# Patient Record
Sex: Male | Born: 1960 | Race: Black or African American | Hispanic: No | Marital: Single | Smoking: Current some day smoker
Health system: Southern US, Community
[De-identification: ages and names within clinical notes are randomized; demographics above are authoritative.]

---

## 2010-04-13 ENCOUNTER — Observation Stay (HOSPITAL_COMMUNITY): Admission: EM | Admit: 2010-04-13 | Discharge: 2010-04-14 | Payer: Self-pay | Admitting: Emergency Medicine

## 2010-04-13 ENCOUNTER — Ambulatory Visit: Payer: Self-pay | Admitting: Cardiology

## 2010-04-14 ENCOUNTER — Encounter (INDEPENDENT_AMBULATORY_CARE_PROVIDER_SITE_OTHER): Payer: Self-pay | Admitting: Emergency Medicine

## 2010-08-15 LAB — CBC
MCH: 29.8 pg (ref 26.0–34.0)
MCHC: 33.9 g/dL (ref 30.0–36.0)
MCV: 88.1 fL (ref 78.0–100.0)
Platelets: 228 10*3/uL (ref 150–400)
RDW: 13.7 % (ref 11.5–15.5)

## 2010-08-15 LAB — BASIC METABOLIC PANEL
CO2: 27 mEq/L (ref 19–32)
Calcium: 9.7 mg/dL (ref 8.4–10.5)
Chloride: 103 mEq/L (ref 96–112)
Creatinine, Ser: 1.03 mg/dL (ref 0.4–1.5)
GFR calc Af Amer: >60 mL/min (ref >60)
Glucose, Bld: 100 mg/dL — ABNORMAL HIGH (ref 70–99)
Sodium: 140 mEq/L (ref 135–145)

## 2010-08-15 LAB — URINALYSIS, ROUTINE W REFLEX MICROSCOPIC
Bilirubin Urine: NEGATIVE
Hgb urine dipstick: NEGATIVE
Ketones, ur: NEGATIVE mg/dL
Nitrite: NEGATIVE
Protein, ur: NEGATIVE mg/dL
Specific Gravity, Urine: 1.005 (ref 1.005–1.030)
Urobilinogen, UA: 0.2 mg/dL (ref 0.0–1.0)

## 2010-08-15 LAB — POCT CARDIAC MARKERS
CKMB, poc: 4 ng/mL (ref 1.0–8.0)
CKMB, poc: 4.4 ng/mL (ref 1.0–8.0)
Myoglobin, poc: 110 ng/mL (ref 12–200)
Myoglobin, poc: 124 ng/mL (ref 12–200)
Myoglobin, poc: 144 ng/mL (ref 12–200)

## 2010-08-15 LAB — DIFFERENTIAL
Basophils Relative: 0 % (ref 0–1)
Eosinophils Absolute: 0.1 10*3/uL (ref 0.0–0.7)
Eosinophils Relative: 1 % (ref 0–5)
Lymphs Abs: 1.6 10*3/uL (ref 0.7–4.0)
Neutrophils Relative %: 69 % (ref 43–77)

## 2011-10-19 ENCOUNTER — Encounter (HOSPITAL_BASED_OUTPATIENT_CLINIC_OR_DEPARTMENT_OTHER): Payer: Self-pay

## 2011-10-19 ENCOUNTER — Emergency Department (HOSPITAL_BASED_OUTPATIENT_CLINIC_OR_DEPARTMENT_OTHER)
Admission: EM | Admit: 2011-10-19 | Discharge: 2011-10-19 | Disposition: A | Discharge status: Home / Self Care | Payer: Self-pay | Attending: Emergency Medicine | Admitting: Emergency Medicine

## 2011-10-19 ENCOUNTER — Emergency Department (HOSPITAL_BASED_OUTPATIENT_CLINIC_OR_DEPARTMENT_OTHER): Payer: Self-pay

## 2011-10-19 DIAGNOSIS — S93499A Sprain of other ligament of unspecified ankle, initial encounter: Secondary | ICD-10-CM | POA: Insufficient documentation

## 2011-10-19 DIAGNOSIS — S86019A Strain of unspecified Achilles tendon, initial encounter: Secondary | ICD-10-CM

## 2011-10-19 DIAGNOSIS — X500XXA Overexertion from strenuous movement or load, initial encounter: Secondary | ICD-10-CM | POA: Insufficient documentation

## 2011-10-19 DIAGNOSIS — M25579 Pain in unspecified ankle and joints of unspecified foot: Secondary | ICD-10-CM | POA: Insufficient documentation

## 2011-10-19 DIAGNOSIS — Y990 Civilian activity done for income or pay: Secondary | ICD-10-CM | POA: Insufficient documentation

## 2011-10-19 MED ORDER — NAPROXEN 500 MG PO TABS: 500.0000 mg | ORAL_TABLET | Freq: Two times a day (BID) | ORAL | Status: AC

## 2011-10-19 MED ORDER — OXYCODONE-ACETAMINOPHEN 5-325 MG PO TABS: 1.0000 | ORAL_TABLET | Freq: Four times a day (QID) | ORAL | Status: AC | PRN

## 2011-10-19 MED ORDER — OXYCODONE-ACETAMINOPHEN 5-325 MG PO TABS
2.0000 | ORAL_TABLET | Freq: Once | ORAL | Status: AC
  Administered 2011-10-19: 2 via ORAL
  Filled 2011-10-19: qty 2

## 2011-10-19 MED ORDER — IBUPROFEN 800 MG PO TABS
800.0000 mg | ORAL_TABLET | Freq: Once | ORAL | Status: AC
  Administered 2011-10-19: 800 mg via ORAL
  Filled 2011-10-19: qty 1

## 2011-10-19 NOTE — ED Notes (Signed)
Was pushing car at work -felt a pop to right posterior LE

## 2011-10-19 NOTE — ED Provider Notes (Signed)
History     CSN: [id number]  Arrival date & time 10/19/11  1140   First MD Initiated Contact with Patient 10/19/11 1202      Chief Complaint  Patient presents with  . Leg Injury    (Consider location/radiation/quality/duration/timing/severity/associated sxs/prior treatment) Patient is a 51 y.o. male presenting with ankle pain. The history is provided by the patient. No language interpreter was used.  Ankle Pain  The incident occurred less than 1 hour ago. The incident occurred at work. Injury mechanism: while pushing a car. The pain is present in the right ankle. The quality of the pain is described as sharp. The pain is severe. The pain has been constant since onset. Associated symptoms include loss of motion. Pertinent negatives include no numbness, no inability to bear weight, no muscle weakness, no loss of sensation and no tingling. He reports no foreign bodies present. The symptoms are aggravated by activity and bearing weight. He has tried nothing for the symptoms.    History reviewed. No pertinent past medical history.  History reviewed. No pertinent past surgical history.  No family history on file.  History  Substance Use Topics  . Smoking status: Never Smoker   . Smokeless tobacco: Not on file  . Alcohol Use: No      Review of Systems  Constitutional: Negative for fever, chills, activity change and fatigue.  HENT: Negative for congestion, sore throat, rhinorrhea and neck stiffness.   Respiratory: Negative for cough and shortness of breath.   Cardiovascular: Negative for chest pain and palpitations.  Gastrointestinal: Negative for nausea, vomiting and abdominal pain.  Genitourinary: Negative for dysuria, urgency, frequency and flank pain.  Musculoskeletal: Positive for arthralgias. Negative for myalgias, joint swelling and gait problem.  Neurological: Negative for dizziness, tingling, weakness, light-headedness, numbness and headaches.  All other systems reviewed  and are negative.    Allergies  Review of patient's allergies indicates no known allergies.  Home Medications   Current Outpatient Rx  Name Route Sig Dispense Refill  . HYDROCODONE-ACETAMINOPHEN PO Oral Take by mouth.    Marland Kitchen NAPROXEN 500 MG PO TABS Oral Take 1 tablet (500 mg total) by mouth 2 (two) times daily. 30 tablet 0  . OXYCODONE-ACETAMINOPHEN 5-325 MG PO TABS Oral Take 1-2 tablets by mouth every 6 (six) hours as needed for pain. 20 tablet 0    BP 125/75  Pulse 90  Temp(Src) 98.8 F (37.1 C) (Oral)  Resp 20  SpO2 98%  Physical Exam  Nursing note and vitals reviewed. Constitutional: He is oriented to person, place, and time. He appears well-developed and well-nourished. No distress.  HENT:  Head: Normocephalic and atraumatic.  Mouth/Throat: Oropharynx is clear and moist.  Eyes: Conjunctivae and EOM are normal. Pupils are equal, round, and reactive to light.  Neck: Normal range of motion. Neck supple.  Cardiovascular: Normal rate, regular rhythm, normal heart sounds and intact distal pulses.  Exam reveals no gallop and no friction rub.   No murmur heard. Pulmonary/Chest: Effort normal and breath sounds normal. No respiratory distress. He exhibits no tenderness.  Abdominal: Soft. Bowel sounds are normal. There is no tenderness. There is no rebound and no guarding.  Musculoskeletal:       Right ankle: Achilles tendon exhibits pain and defect.       On reviewing the right Achilles tendon under ultrasound it appears there is a slight defect consistent with at least a partial tear. He was able to plantar flex his right foot only slightly.  Neurological:  He is alert and oriented to person, place, and time. He has normal strength. No cranial nerve deficit or sensory deficit.  Skin: Skin is warm and dry. No rash noted.    ED Course  Procedures (including critical care time)  Labs Reviewed - No data to display Dg Ankle Complete Right  10/19/2011  *RADIOLOGY REPORT*  Clinical  Data: Ankle pain, injury.  RIGHT ANKLE - COMPLETE 3+ VIEW  Comparison: None  Findings: No acute bony abnormality.  Specifically, no fracture, subluxation, or dislocation.  Soft tissues are intact.  Joint spaces are maintained.  IMPRESSION: No acute bony abnormality.  Original Report Authenticated By: Cyndie Chime, M.D.     1. Achilles rupture       MDM  Ultrasound shows partial Achilles tendon rupture. X-rays negative. Was placed in a plantar flexed splint. Be placed on crutches. Encouraged to elevate apply ice on not ambulatory. Instructed to followup with orthopedics on Tuesday after talking with Dr. Ranell Patrick.        Dayton Bailiff, MD 10/19/11 1304

## 2011-10-19 NOTE — Discharge Instructions (Signed)
Partial (Incomplete) Achilles Tendon Rupture  Tendons are the tough fibrous and stretchy (elastic) tissues that connect muscle to bone. The Achilles tendon is the large cord-like structure (tendon) in the back of the leg, just above the foot. It attaches the large muscles of the lower leg to the heel bone. You can feel this as the large cord just above the heel. The diagnosis of incomplete Achilles tendon tear (rupture) is made by examination. X-rays will determine the extent of the damage (injury). The injury may be casted or immobilized for 6 to 10 weeks. An incomplete tear of the tendon may repair itself with rest and immobilization. Immobilization means that the injured tendon is kept in position with a cast or splint. It is not allowed to move. Once your caregiver feels you have healed well enough, he or she will provide exercises you can do to make the injured tendon feel better (rehabilitate). HOME CARE INSTRUCTIONS   Keep the leg elevated above the level of the heart (the center of the chest) at all times when not using the bathroom. Do not dangle the leg over a chair, couch, or bed. When lying down, elevate your leg on a couple pillows. Elevation prevents swelling and reduces pain.   Apply ice to the injury for 15 to 20 minutes, 3 to 4 times per day. Put the ice in a plastic bag and place a towel between the bag of ice and your skin, splint, or immobilization device.   Use crutches and move about only as instructed.   Avoid use other than gentle range of motion of the toes while the tendon is painful. Do not resume use until instructed by your caregiver. Usually, full rehabilitation will begin sometime after the casts or splints are removed. Then begin use gradually, as directed. Do not increase use to the point of pain. If pain does develop, decrease use and continue the above measures. Gradually increase activities that do not cause discomfort until you achieve normal use without pain.   Do  not drive a car until your caregiver specifically tells you it is safe to do so.   Only take over-the-counter or prescription medicines for pain, discomfort, or fever as directed by your caregiver.   If your caregiver has given you a follow-up appointment, it is very important to keep that appointment. Not keeping the appointment could result in a chronic or permanent injury, pain, and disability. If there is any problem keeping the appointment, you must call back to this facility for assistance.  SEEK MEDICAL CARE IF:   Your pain and swelling increase or pain is uncontrolled with medications.   You develop new unexplained problems (symptoms) or an increase of the symptoms which brought you to your caregiver.   You develop an inability to move your toes or foot, develop warmth and swelling in your foot, or begin running an unexplained temperature.  MAKE SURE YOU:   Understand these instructions.   Will watch your condition.   Will get help right away if you are not doing well or get worse.  Document Released: 02/28/2005 Document Revised: 05/10/2011 Document Reviewed: 12/23/2007 [employer] Patient Information 2012 [employer]. 

## 2011-12-04 ENCOUNTER — Ambulatory Visit: Payer: Self-pay | Attending: Orthopedic Surgery

## 2011-12-04 DIAGNOSIS — M6281 Muscle weakness (generalized): Secondary | ICD-10-CM | POA: Insufficient documentation

## 2011-12-04 DIAGNOSIS — M25579 Pain in unspecified ankle and joints of unspecified foot: Secondary | ICD-10-CM | POA: Insufficient documentation

## 2011-12-04 DIAGNOSIS — R269 Unspecified abnormalities of gait and mobility: Secondary | ICD-10-CM | POA: Insufficient documentation

## 2011-12-04 DIAGNOSIS — M25673 Stiffness of unspecified ankle, not elsewhere classified: Secondary | ICD-10-CM | POA: Insufficient documentation

## 2011-12-04 DIAGNOSIS — IMO0001 Reserved for inherently not codable concepts without codable children: Secondary | ICD-10-CM | POA: Insufficient documentation

## 2011-12-04 DIAGNOSIS — M25676 Stiffness of unspecified foot, not elsewhere classified: Secondary | ICD-10-CM | POA: Insufficient documentation

## 2011-12-07 ENCOUNTER — Ambulatory Visit: Payer: Self-pay | Admitting: Physical Therapy

## 2011-12-10 ENCOUNTER — Ambulatory Visit: Payer: Self-pay | Admitting: Physical Therapy

## 2011-12-13 ENCOUNTER — Ambulatory Visit: Payer: Self-pay

## 2011-12-17 ENCOUNTER — Ambulatory Visit: Payer: Self-pay

## 2011-12-20 ENCOUNTER — Ambulatory Visit: Payer: Self-pay

## 2011-12-24 ENCOUNTER — Ambulatory Visit: Payer: Self-pay

## 2011-12-27 ENCOUNTER — Ambulatory Visit: Payer: Self-pay | Admitting: Physical Therapy

## 2012-01-02 ENCOUNTER — Ambulatory Visit: Payer: Self-pay

## 2012-01-04 ENCOUNTER — Encounter: Payer: Self-pay | Admitting: Physical Therapy

## 2018-12-05 ENCOUNTER — Emergency Department (HOSPITAL_COMMUNITY)
Admission: EM | Admit: 2018-12-05 | Discharge: 2018-12-05 | Disposition: A | Discharge status: Home / Self Care | Payer: Self-pay | Attending: Emergency Medicine | Admitting: Emergency Medicine

## 2018-12-05 ENCOUNTER — Encounter (HOSPITAL_COMMUNITY): Payer: Self-pay | Admitting: Emergency Medicine

## 2018-12-05 ENCOUNTER — Emergency Department (HOSPITAL_COMMUNITY): Payer: Self-pay

## 2018-12-05 ENCOUNTER — Other Ambulatory Visit: Payer: Self-pay

## 2018-12-05 DIAGNOSIS — N200 Calculus of kidney: Secondary | ICD-10-CM | POA: Insufficient documentation

## 2018-12-05 DIAGNOSIS — F17200 Nicotine dependence, unspecified, uncomplicated: Secondary | ICD-10-CM | POA: Insufficient documentation

## 2018-12-05 LAB — CBC
HCT: 38.3 % — ABNORMAL LOW (ref 39.0–52.0)
Hemoglobin: 12.5 g/dL — ABNORMAL LOW (ref 13.0–17.0)
MCH: 30.7 pg (ref 26.0–34.0)
MCHC: 32.6 g/dL (ref 30.0–36.0)
MCV: 94.1 fL (ref 80.0–100.0)
Platelets: 263 10*3/uL (ref 150–400)
RBC: 4.07 MIL/uL — ABNORMAL LOW (ref 4.22–5.81)
RDW: 15.5 % (ref 11.5–15.5)
WBC: 13.6 10*3/uL — ABNORMAL HIGH (ref 4.0–10.5)
nRBC: 0 % (ref 0.0–0.2)

## 2018-12-05 LAB — COMPREHENSIVE METABOLIC PANEL
ALT: 25 U/L (ref 0–44)
AST: 24 U/L (ref 15–41)
Albumin: 4.2 g/dL (ref 3.5–5.0)
Alkaline Phosphatase: 54 U/L (ref 38–126)
Anion gap: 11 (ref 5–15)
BUN: 14 mg/dL (ref 6–20)
CO2: 25 mmol/L (ref 22–32)
Calcium: 9.4 mg/dL (ref 8.9–10.3)
Chloride: 105 mmol/L (ref 98–111)
Creatinine, Ser: 1.39 mg/dL — ABNORMAL HIGH (ref 0.61–1.24)
GFR calc Af Amer: >60 mL/min (ref >60)
GFR calc non Af Amer: 57 mL/min — ABNORMAL LOW (ref >60)
Glucose, Bld: 135 mg/dL — ABNORMAL HIGH (ref 70–99)
Potassium: 3.6 mmol/L (ref 3.5–5.1)
Sodium: 141 mmol/L (ref 135–145)
Total Bilirubin: 0.5 mg/dL (ref 0.3–1.2)
Total Protein: 7.6 g/dL (ref 6.5–8.1)

## 2018-12-05 LAB — URINALYSIS, ROUTINE W REFLEX MICROSCOPIC
Bilirubin Urine: NEGATIVE
Glucose, UA: NEGATIVE mg/dL
Hgb urine dipstick: NEGATIVE
Ketones, ur: NEGATIVE mg/dL
Leukocytes,Ua: NEGATIVE
Nitrite: NEGATIVE
Protein, ur: NEGATIVE mg/dL
Specific Gravity, Urine: 1.034 — ABNORMAL HIGH (ref 1.005–1.030)
pH: 6 (ref 5.0–8.0)

## 2018-12-05 LAB — LIPASE, BLOOD: Lipase: 45 U/L (ref 11–51)

## 2018-12-05 MED ORDER — MORPHINE SULFATE 15 MG PO TABS: 15.0000 mg | ORAL_TABLET | ORAL | 0 refills | Status: AC | PRN

## 2018-12-05 MED ORDER — ONDANSETRON HCL 4 MG/2ML IJ SOLN
4.0000 mg | Freq: Once | INTRAMUSCULAR | Status: AC
  Administered 2018-12-05: 4 mg via INTRAVENOUS
  Filled 2018-12-05: qty 2

## 2018-12-05 MED ORDER — LACTATED RINGERS IV BOLUS
1000.0000 mL | Freq: Once | INTRAVENOUS | Status: AC
  Administered 2018-12-05: 1000 mL via INTRAVENOUS

## 2018-12-05 MED ORDER — MORPHINE SULFATE (PF) 4 MG/ML IV SOLN
4.0000 mg | Freq: Once | INTRAVENOUS | Status: AC
  Administered 2018-12-05: 4 mg via INTRAVENOUS
  Filled 2018-12-05: qty 1

## 2018-12-05 MED ORDER — IOPAMIDOL (ISOVUE-300) INJECTION 61%
100.0000 mL | Freq: Once | INTRAVENOUS | Status: AC | PRN
  Administered 2018-12-05: 100 mL via INTRAVENOUS

## 2018-12-05 MED ORDER — TAMSULOSIN HCL 0.4 MG PO CAPS: 0.4000 mg | ORAL_CAPSULE | Freq: Every day | ORAL | 0 refills | Status: AC

## 2018-12-05 MED ORDER — ONDANSETRON 4 MG PO TBDP: 4.0000 mg | ORAL_TABLET | Freq: Three times a day (TID) | ORAL | 0 refills | Status: AC | PRN

## 2018-12-05 MED ORDER — KETOROLAC TROMETHAMINE 15 MG/ML IJ SOLN
15.0000 mg | Freq: Once | INTRAMUSCULAR | Status: AC
  Administered 2018-12-05: 15 mg via INTRAVENOUS
  Filled 2018-12-05: qty 1

## 2018-12-05 MED ORDER — FENTANYL CITRATE (PF) 100 MCG/2ML IJ SOLN
50.0000 ug | Freq: Once | INTRAMUSCULAR | Status: AC
Start: 2018-12-05 — End: 2018-12-05
  Administered 2018-12-05: 50 ug via INTRAVENOUS
  Filled 2018-12-05: qty 2

## 2018-12-05 NOTE — ED Notes (Signed)
Patient made aware he needs to provide urine.

## 2018-12-05 NOTE — ED Triage Notes (Signed)
Patient woke up this morning with left abdominal pain , denies emesis or diarrhea , no fever or chills .

## 2018-12-05 NOTE — Discharge Instructions (Signed)
Return for fever or uncontrolled pain.  Follow up with the urologist.  Follow up with your PCP.   Take 4 over the counter ibuprofen tablets 3 times a day or 2 over-the-counter naproxen tablets twice a day for pain. Also take tylenol 1000mg (2 extra strength) four times a day.   Then take the pain medicine if you feel like you need it. Narcotics do not help with the pain, they only make you care about it less.  You can become addicted to this, people may break into your house to steal it.  It will constipate you.  If you drive under the influence of this medicine you can get a DUI.

## 2018-12-05 NOTE — ED Provider Notes (Signed)
I received the patient in signout from Dr. Dayna Barker.  Briefly the patient is a 58 year old male with a chief complaint of left lower quadrant abdominal pain.  Occurred after sexual intercourse.  CT scan pending to evaluate for diverticulitis.  Patient was found to have a 6 mm UVJ stone.  Patient feeling mildly better on my reassessment.  UA is negative for infection.  We will have the patient follow-up with urology.  Short course of narcotics and Flomax.  Will return for fever her uncontrolled pain.   Deno Etienne, DO 12/05/18 [phone]

## 2018-12-05 NOTE — ED Notes (Signed)
[place] to pt [phone]

## 2018-12-05 NOTE — ED Notes (Signed)
Patient transported to CT scan . 

## 2018-12-05 NOTE — ED Provider Notes (Signed)
Emergency Department Provider Note   I have reviewed the triage vital signs and the nursing notes.   HISTORY  Chief Complaint Abdominal Pain   HPI Jason Torres is a 58 y.o. male this morning with a severe sharp pain to his left lower quadrant and nausea.  No diarrhea or constipation.  States when he went to the bathroom to see did have a bowel movement to see if that would help he had little urination which seemed to make the pain worse.   Never any that is before.  No testicular pain.  Does not radiate to his back or anywhere else in his abdomen.  No rashes.  No recent trauma.  No alcohol or tobacco.  Patient states he did have intercourse last night but the pain did not start till this morning.  No other associated or modifying symptoms.    History reviewed. No pertinent past medical history.  There are no active problems to display for this patient.   History reviewed. No pertinent surgical history.  Current Outpatient Rx  . Order #: [id number] Class: Normal  . Order #: [id number] Class: Normal  . Order #: [id number] Class: Normal    Allergies Patient has no known allergies.  No family history on file.  Social History Social History   Tobacco Use  . Smoking status: Current Some Day Smoker  . Smokeless tobacco: Never Used  Substance Use Topics  . Alcohol use: No  . Drug use: Yes    Types: Marijuana    Review of Systems  All other systems negative except as documented in the HPI. All pertinent positives and negatives as reviewed in the HPI. ____________________________________________   PHYSICAL EXAM:  VITAL SIGNS: ED Triage Vitals  Enc Vitals Group     BP 12/05/18 0548 (!) 150/94     Pulse Rate 12/05/18 0548 62     Resp 12/05/18 0548 16     Temp 12/05/18 0548 98.4 F (36.9 C)     Temp Source 12/05/18 0548 Oral     SpO2 12/05/18 0548 99 %    Constitutional: Alert and oriented. Well appearing and in no acute distress. Eyes: Conjunctivae are  normal. PERRL. EOMI. Head: Atraumatic. Nose: No congestion/rhinnorhea. Mouth/Throat: Mucous membranes are moist.  Oropharynx non-erythematous. Neck: No stridor.  No meningeal signs.   Cardiovascular: Normal rate, regular rhythm. Good peripheral circulation. Grossly normal heart sounds.   Respiratory: Normal respiratory effort.  No retractions. Lungs CTAB. Gastrointestinal: Soft and mild ttp in LLQ without rebound, guarding or other e/o peritonitis. No distention.  Musculoskeletal: No lower extremity tenderness nor edema. No gross deformities of extremities. Neurologic:  Normal speech and language. No gross focal neurologic deficits are appreciated.  Skin:  Skin is warm, dry and intact. No rash noted.  ____________________________________________   LABS (all labs ordered are listed, but only abnormal results are displayed)  Labs Reviewed  COMPREHENSIVE METABOLIC PANEL - Abnormal; Notable for the following components:      Result Value   Glucose, Bld 135 (*)    Creatinine, Ser 1.39 (*)    GFR calc non Af Amer 57 (*)    All other components within normal limits  CBC - Abnormal; Notable for the following components:   WBC 13.6 (*)    RBC 4.07 (*)    Hemoglobin 12.5 (*)    HCT 38.3 (*)    All other components within normal limits  URINALYSIS, ROUTINE W REFLEX MICROSCOPIC - Abnormal; Notable for the following components:   Specific  Gravity, Urine 1.034 (*)    All other components within normal limits  LIPASE, BLOOD   ____________________________________________  EKG   EKG Interpretation  Date/Time:    Ventricular Rate:    PR Interval:    QRS Duration:   QT Interval:    QTC Calculation:   R Axis:     Text Interpretation:         ____________________________________________  RADIOLOGY  No results found.  ____________________________________________   PROCEDURES  Procedure(s) performed:   Procedures   ____________________________________________    INITIAL IMPRESSION / ASSESSMENT AND PLAN / ED COURSE  eval for diverticulitis. No testicular symptoms to suggest torsion. No rash/trauma. No h/o kidney stones.  Symptomatic treatment and CT pending.   Care transferred pending ct and lab results.    Pertinent labs & imaging results that were available during my care of the patient were reviewed by me and considered in my medical decision making (see chart for details).  ____________________________________________  FINAL CLINICAL IMPRESSION(S) / ED DIAGNOSES  Final diagnoses:  Nephrolithiasis     MEDICATIONS GIVEN DURING THIS VISIT:  Medications  fentaNYL (SUBLIMAZE) injection 50 mcg (50 mcg Intravenous Given 12/05/18 0622)  ondansetron (ZOFRAN) injection 4 mg (4 mg Intravenous Given 12/05/18 0622)  lactated ringers bolus 1,000 mL (0 mLs Intravenous Stopped 12/05/18 0721)  iopamidol (ISOVUE-300) 61 % injection 100 mL (100 mLs Intravenous Contrast Given 12/05/18 0702)  ketorolac (TORADOL) 15 MG/ML injection 15 mg (15 mg Intravenous Given 12/05/18 0807)  morphine 4 MG/ML injection 4 mg (4 mg Intravenous Given 12/05/18 0806)     NEW OUTPATIENT MEDICATIONS STARTED DURING THIS VISIT:  Discharge Medication List as of 12/05/2018  8:08 AM    START taking these medications   Details  morphine (MSIR) 15 MG tablet Take 1 tablet (15 mg total) by mouth every 4 (four) hours as needed for severe pain., Starting Fri 12/05/2018, Normal    ondansetron (ZOFRAN ODT) 4 MG disintegrating tablet Take 1 tablet (4 mg total) by mouth every 8 (eight) hours as needed for nausea or vomiting., Starting Fri 12/05/2018, Normal    tamsulosin (FLOMAX) 0.4 MG CAPS capsule Take 1 capsule (0.4 mg total) by mouth daily after supper., Starting Fri 12/05/2018, Normal        Note:  This note was prepared with assistance of Dragon voice recognition software. Occasional wrong-word or sound-a-like substitutions may have occurred due to the inherent limitations of voice recognition  software.   Marily MemosMesner, Madeliene Tejera, MD 12/10/18 1438

## 2020-03-09 IMAGING — CT CT ABDOMEN AND PELVIS WITH CONTRAST
2 of 5 series · 16 of 46 positions shown, 18 images · IV contrast (Omni 300)
Comparison: None.

CLINICAL DATA: Left-sided abdominal pain.

EXAM:
CT ABDOMEN AND PELVIS WITH CONTRAST
TECHNIQUE: Multidetector CT imaging of the abdomen and pelvis was performed
using the standard protocol following bolus administration of
intravenous contrast.
CONTRAST:  100mL TA0LGX-V44 IOPAMIDOL (TA0LGX-V44) INJECTION 61%

[Series 3: a/p w/ 5mm · axial · 0.80mm/px · z∈[+1013,+1453]mm · 13 of 100 slices shown, 15 images]
[im 6/100  soft-tissue]
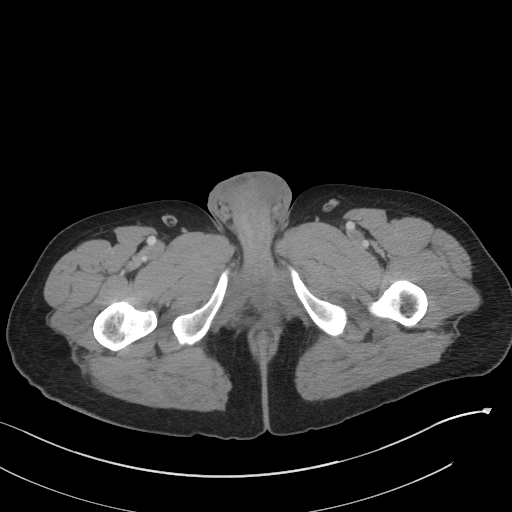
[im 6/100  bone]
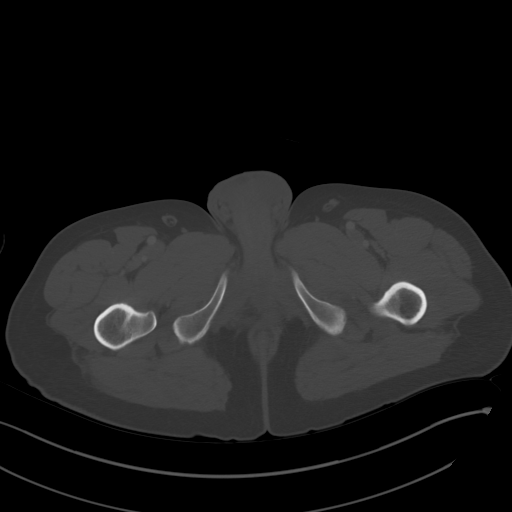
[im 12/100  soft-tissue]
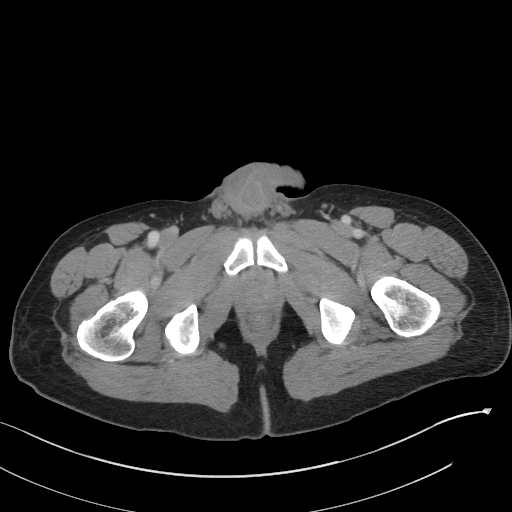
[im 23/100  soft-tissue]
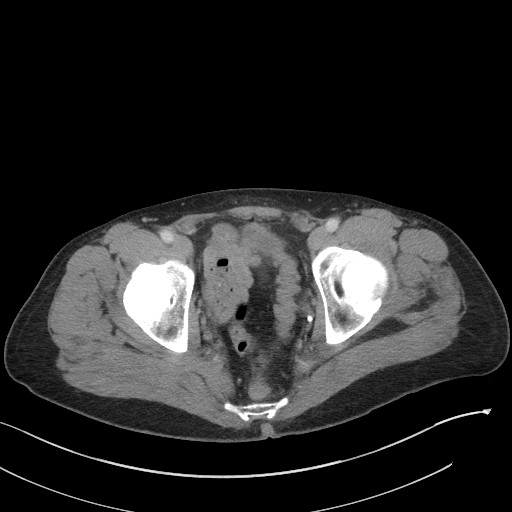
[im 28/100  soft-tissue]
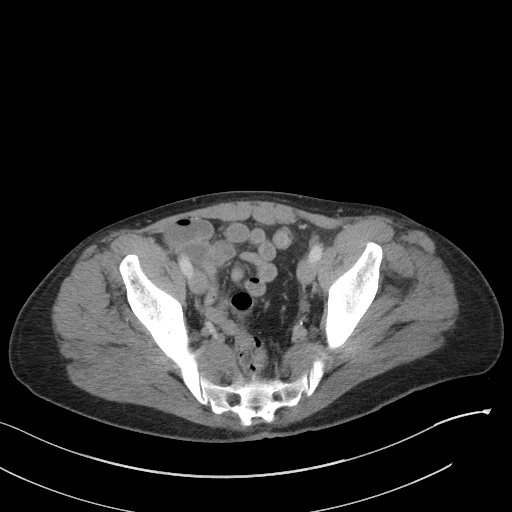
[im 34/100  soft-tissue]
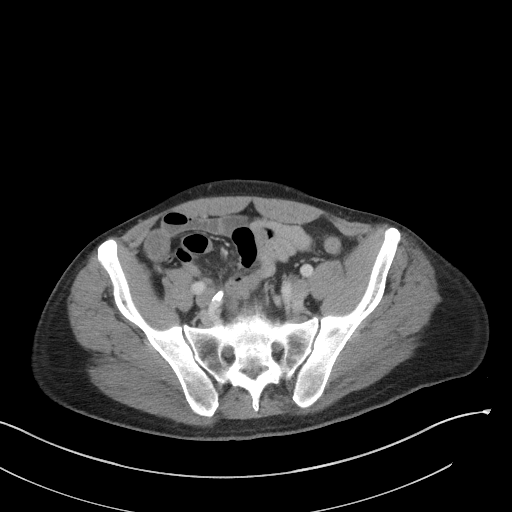
[im 45/100  soft-tissue]
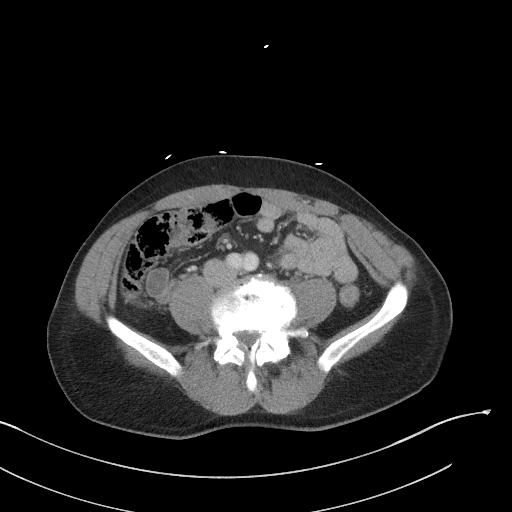
[im 50/100  soft-tissue]
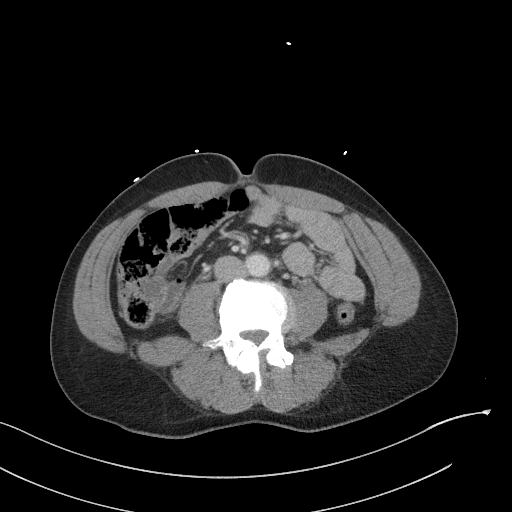
[im 56/100  soft-tissue]
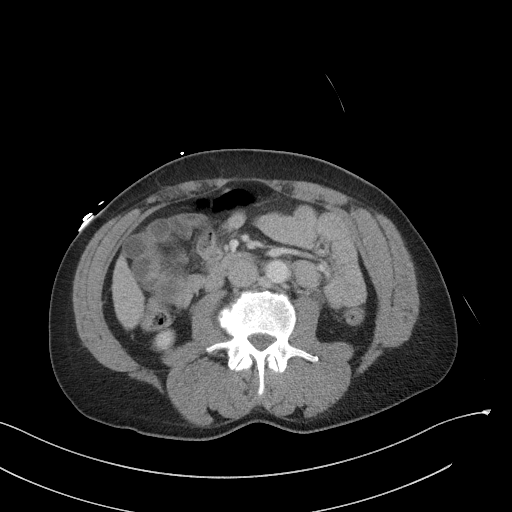
[im 67/100  soft-tissue]
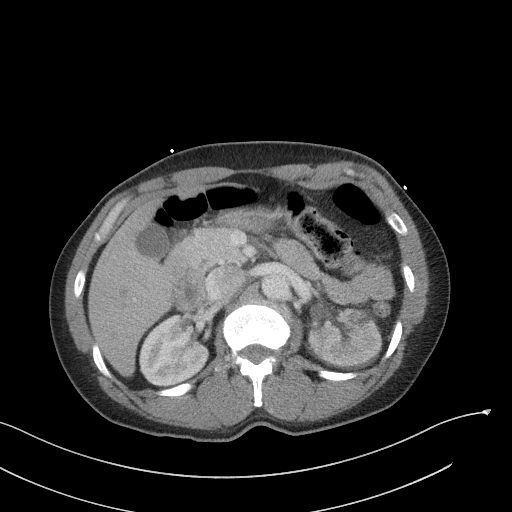
[im 67/100  bone]
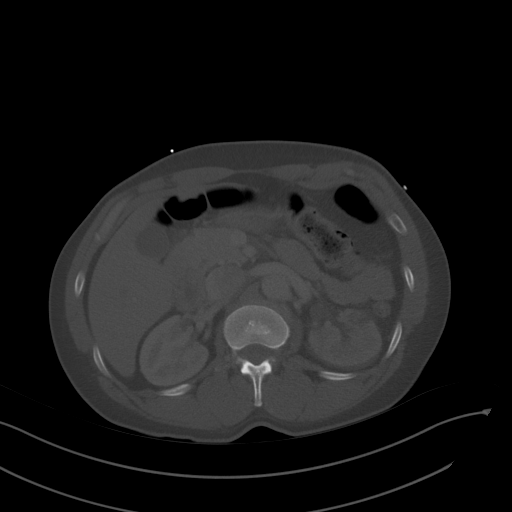
[im 72/100  soft-tissue]
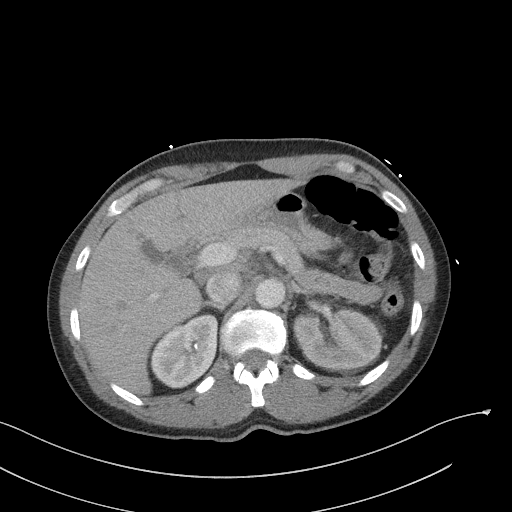
[im 78/100  soft-tissue]
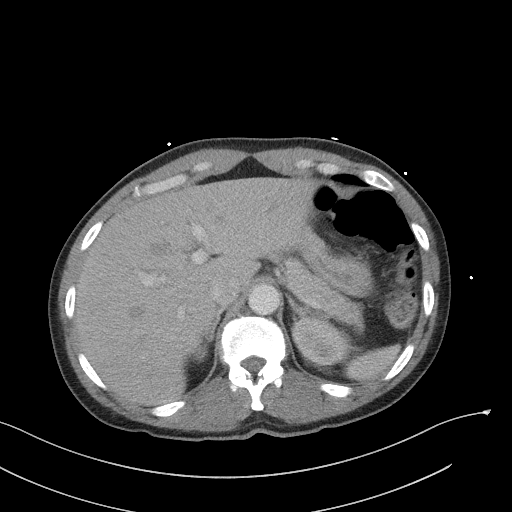
[im 89/100  soft-tissue]
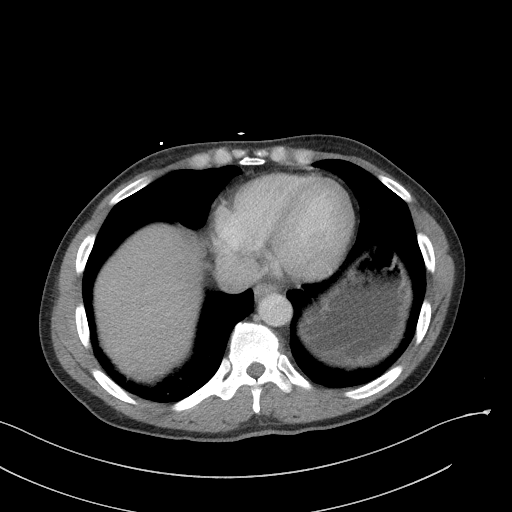
[im 94/100  soft-tissue]
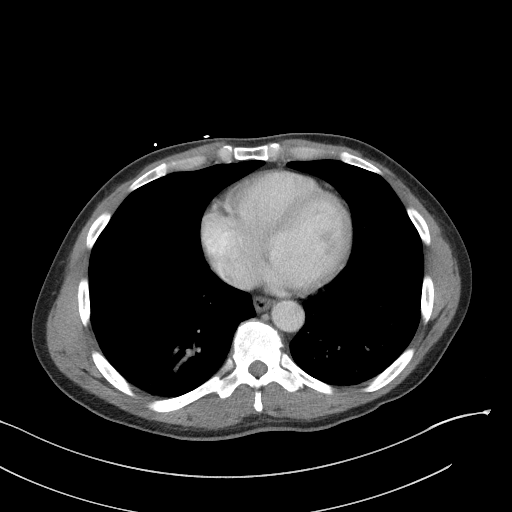

[Series 6: a/p w/ cor · coronal · 1.08mm/px · 3 of 185 slices shown]
[im 62/185  soft-tissue]
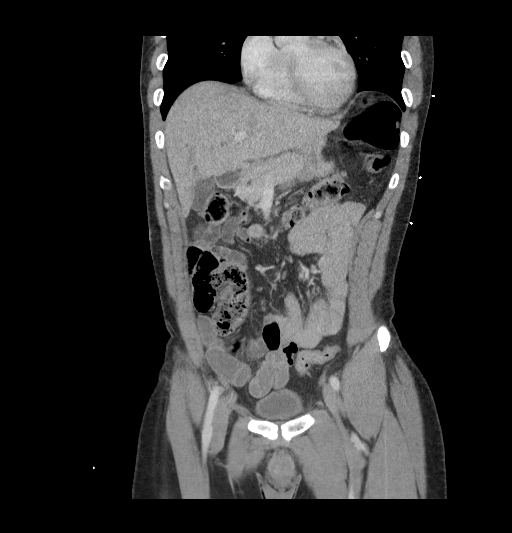
[im 82/185  soft-tissue]
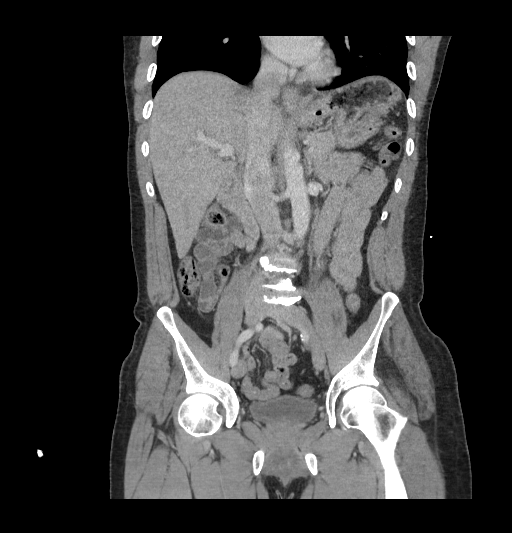
[im 103/185  soft-tissue]
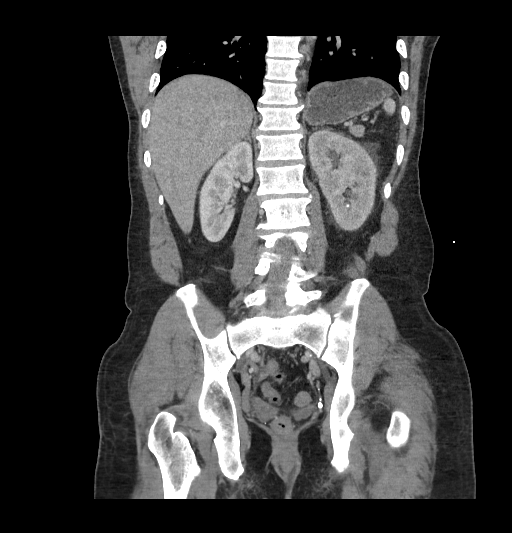

[16 of 46 positions shown; findings below may reference images not displayed]

FINDINGS: Lower chest: No acute abnormality. Mild bilateral lower lobe
subsegmental atelectasis.

Hepatobiliary: Tiny subcentimeter low-density lesions in segments 8
and 4 are too small to characterize, but likely cysts. The
gallbladder is unremarkable. No biliary dilatation.

Pancreas: Unremarkable. No pancreatic ductal dilatation or
surrounding inflammatory changes.

Spleen: Normal in size without focal abnormality.

Adrenals/Urinary Tract: The adrenal glands are unremarkable. 6 mm
calculus in the bladder at the left UVJ with resultant mild left
hydroureteronephrosis and mild left perinephric and peripelvic fat
stranding. Delayed left renal enhancement and contrast excretion.
Additional small bilateral nonobstructive renal calculi. Multiple
small left renal cysts.

Stomach/Bowel: Stomach is within normal limits. Appendix appears
normal. No evidence of bowel wall thickening, distention, or
inflammatory changes.

Vascular/Lymphatic: No significant vascular findings are present. No
enlarged abdominal or pelvic lymph nodes.

Reproductive: Mild prostatomegaly.

Other: No abdominal wall hernia or abnormality. No abdominopelvic
ascites. No pneumoperitoneum.

Musculoskeletal: No acute or significant osseous findings. Advanced
degenerative disc disease at L3-L4 and L4-L5.
IMPRESSION: 1. 6 mm calculus at the left UVJ with resultant mild left
hydroureteronephrosis. Delayed left nephrogram.
2. Additional bilateral nonobstructive nephrolithiasis.
